# Patient Record
Sex: Female | Born: 1995 | Race: White | Hispanic: No | Marital: Single | Smoking: Never smoker
Health system: Southern US, Community
[De-identification: ages and names within clinical notes are randomized; demographics above are authoritative.]

## PROBLEM LIST (undated history)

## (undated) ENCOUNTER — Emergency Department (HOSPITAL_COMMUNITY): Admission: EM | Payer: Self-pay | Source: Home / Self Care

---
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
## 2015-03-29 ENCOUNTER — Ambulatory Visit (INDEPENDENT_AMBULATORY_CARE_PROVIDER_SITE_OTHER): Payer: Medicaid Other | Admitting: Family

## 2015-03-29 ENCOUNTER — Encounter: Payer: Self-pay | Admitting: Family

## 2015-03-29 VITALS — BP 109/72 | HR 68 | Temp 97.8°F | Ht 67.5 in | Wt 211.8 lb

## 2015-03-29 DIAGNOSIS — Z30011 Encounter for initial prescription of contraceptive pills: Secondary | ICD-10-CM

## 2015-03-29 DIAGNOSIS — Z23 Encounter for immunization: Secondary | ICD-10-CM | POA: Diagnosis not present

## 2015-03-29 LAB — POCT URINE PREGNANCY: Preg Test, Ur: NEGATIVE

## 2015-03-29 MED ORDER — NORGESTIM-ETH ESTRAD TRIPHASIC 0.18/0.215/0.25 MG-25 MCG PO TABS: 1.0000 | ORAL_TABLET | Freq: Every day | ORAL | Status: DC

## 2015-03-29 NOTE — Patient Instructions (Signed)
Health Maintenance, Female Adopting a healthy lifestyle and getting preventive care can go a long way to promote health and wellness. Talk with your health care provider about what schedule of regular examinations is right for you. This is a good chance for you to check in with your provider about disease prevention and staying healthy. In between checkups, there are plenty of things you can do on your own. Experts have done a lot of research about which lifestyle changes and preventive measures are most likely to keep you healthy. Ask your health care provider for more information. WEIGHT AND DIET  Eat a healthy diet  Be sure to include plenty of vegetables, fruits, low-fat dairy products, and lean protein.  Do not eat a lot of foods high in solid fats, added sugars, or salt.  Get regular exercise. This is one of the most important things you can do for your health.  Most adults should exercise for at least 150 minutes each week. The exercise should increase your heart rate and make you sweat (moderate-intensity exercise).  Most adults should also do strengthening exercises at least twice a week. This is in addition to the moderate-intensity exercise.  Maintain a healthy weight  Body mass index (BMI) is a measurement that can be used to identify possible weight problems. It estimates body fat based on height and weight. Your health care provider can help determine your BMI and help you achieve or maintain a healthy weight.  For females 20 years of age and older:   A BMI below 18.5 is considered underweight.  A BMI of 18.5 to 24.9 is normal.  A BMI of 25 to 29.9 is considered overweight.  A BMI of 30 and above is considered obese.  Watch levels of cholesterol and blood lipids  You should start having your blood tested for lipids and cholesterol at 20 years of age, then have this test every 5 years.  You may need to have your cholesterol levels checked more often if:  Your lipid  or cholesterol levels are high.  You are older than 19 years of age.  You are at high risk for heart disease.  CANCER SCREENING   Lung Cancer  Lung cancer screening is recommended for adults 55-80 years old who are at high risk for lung cancer because of a history of smoking.  A yearly low-dose CT scan of the lungs is recommended for people who:  Currently smoke.  Have quit within the past 15 years.  Have at least a 30-pack-year history of smoking. A pack year is smoking an average of one pack of cigarettes a day for 1 year.  Yearly screening should continue until it has been 15 years since you quit.  Yearly screening should stop if you develop a health problem that would prevent you from having lung cancer treatment.  Breast Cancer  Practice breast self-awareness. This means understanding how your breasts normally appear and feel.  It also means doing regular breast self-exams. Let your health care provider know about any changes, no matter how small.  If you are in your 20s or 30s, you should have a clinical breast exam (CBE) by a health care provider every 1-3 years as part of a regular health exam.  If you are 40 or older, have a CBE every year. Also consider having a breast X-ray (mammogram) every year.  If you have a family history of breast cancer, talk to your health care provider about genetic screening.  If you   are at high risk for breast cancer, talk to your health care provider about having an MRI and a mammogram every year.  Breast cancer gene (BRCA) assessment is recommended for women who have family members with BRCA-related cancers. BRCA-related cancers include:  Breast.  Ovarian.  Tubal.  Peritoneal cancers.  Results of the assessment will determine the need for genetic counseling and BRCA1 and BRCA2 testing. Cervical Cancer Your health care provider may recommend that you be screened regularly for cancer of the pelvic organs (ovaries, uterus, and  vagina). This screening involves a pelvic examination, including checking for microscopic changes to the surface of your cervix (Pap test). You may be encouraged to have this screening done every 3 years, beginning at age 21.  For women ages 30-65, health care providers may recommend pelvic exams and Pap testing every 3 years, or they may recommend the Pap and pelvic exam, combined with testing for human papilloma virus (HPV), every 5 years. Some types of HPV increase your risk of cervical cancer. Testing for HPV may also be done on women of any age with unclear Pap test results.  Other health care providers may not recommend any screening for nonpregnant women who are considered low risk for pelvic cancer and who do not have symptoms. Ask your health care provider if a screening pelvic exam is right for you.  If you have had past treatment for cervical cancer or a condition that could lead to cancer, you need Pap tests and screening for cancer for at least 20 years after your treatment. If Pap tests have been discontinued, your risk factors (such as having a new sexual partner) need to be reassessed to determine if screening should resume. Some women have medical problems that increase the chance of getting cervical cancer. In these cases, your health care provider may recommend more frequent screening and Pap tests. Colorectal Cancer  This type of cancer can be detected and often prevented.  Routine colorectal cancer screening usually begins at 19 years of age and continues through 19 years of age.  Your health care provider may recommend screening at an earlier age if you have risk factors for colon cancer.  Your health care provider may also recommend using home test kits to check for hidden blood in the stool.  A small camera at the end of a tube can be used to examine your colon directly (sigmoidoscopy or colonoscopy). This is done to check for the earliest forms of colorectal  cancer.  Routine screening usually begins at age 50.  Direct examination of the colon should be repeated every 5-10 years through 19 years of age. However, you may need to be screened more often if early forms of precancerous polyps or small growths are found. Skin Cancer  Check your skin from head to toe regularly.  Tell your health care provider about any new moles or changes in moles, especially if there is a change in a mole's shape or color.  Also tell your health care provider if you have a mole that is larger than the size of a pencil eraser.  Always use sunscreen. Apply sunscreen liberally and repeatedly throughout the day.  Protect yourself by wearing long sleeves, pants, a wide-brimmed hat, and sunglasses whenever you are outside. HEART DISEASE, DIABETES, AND HIGH BLOOD PRESSURE   High blood pressure causes heart disease and increases the risk of stroke. High blood pressure is more likely to develop in:  People who have blood pressure in the high end   of the normal range (130-139/85-89 mm Hg).  People who are overweight or obese.  People who are African American.  If you are 38-23 years of age, have your blood pressure checked every 3-5 years. If you are 61 years of age or older, have your blood pressure checked every year. You should have your blood pressure measured twice--once when you are at a hospital or clinic, and once when you are not at a hospital or clinic. Record the average of the two measurements. To check your blood pressure when you are not at a hospital or clinic, you can use:  An automated blood pressure machine at a pharmacy.  A home blood pressure monitor.  If you are between 45 years and 39 years old, ask your health care provider if you should take aspirin to prevent strokes.  Have regular diabetes screenings. This involves taking a blood sample to check your fasting blood sugar level.  If you are at a normal weight and have a low risk for diabetes,  have this test once every three years after 19 years of age.  If you are overweight and have a high risk for diabetes, consider being tested at a younger age or more often. PREVENTING INFECTION  Hepatitis B  If you have a higher risk for hepatitis B, you should be screened for this virus. You are considered at high risk for hepatitis B if:  You were born in a country where hepatitis B is common. Ask your health care provider which countries are considered high risk.  Your parents were born in a high-risk country, and you have not been immunized against hepatitis B (hepatitis B vaccine).  You have HIV or AIDS.  You use needles to inject street drugs.  You live with someone who has hepatitis B.  You have had sex with someone who has hepatitis B.  You get hemodialysis treatment.  You take certain medicines for conditions, including cancer, organ transplantation, and autoimmune conditions. Hepatitis C  Blood testing is recommended for:  Everyone born from [birth year] through 1965.  Anyone with known risk factors for hepatitis C. Sexually transmitted infections (STIs)  You should be screened for sexually transmitted infections (STIs) including gonorrhea and chlamydia if:  You are sexually active and are younger than 19 years of age.  You are older than 19 years of age and your health care provider tells you that you are at risk for this type of infection.  Your sexual activity has changed since you were last screened and you are at an increased risk for chlamydia or gonorrhea. Ask your health care provider if you are at risk.  If you do not have HIV, but are at risk, it may be recommended that you take a prescription medicine daily to prevent HIV infection. This is called pre-exposure prophylaxis (PrEP). You are considered at risk if:  You are sexually active and do not regularly use condoms or know the HIV status of your partner(s).  You take drugs by injection.  You are sexually  active with a partner who has HIV. Talk with your health care provider about whether you are at high risk of being infected with HIV. If you choose to begin PrEP, you should first be tested for HIV. You should then be tested every 3 months for as long as you are taking PrEP.  PREGNANCY   If you are premenopausal and you may become pregnant, ask your health care provider about preconception counseling.  If you may  become pregnant, take 400 to 800 micrograms (mcg) of folic acid every day.  If you want to prevent pregnancy, talk to your health care provider about birth control (contraception). OSTEOPOROSIS AND MENOPAUSE   Osteoporosis is a disease in which the bones lose minerals and strength with aging. This can result in serious bone fractures. Your risk for osteoporosis can be identified using a bone density scan.  If you are 61 years of age or older, or if you are at risk for osteoporosis and fractures, ask your health care provider if you should be screened.  Ask your health care provider whether you should take a calcium or vitamin D supplement to lower your risk for osteoporosis.  Menopause may have certain physical symptoms and risks.  Hormone replacement therapy may reduce some of these symptoms and risks. Talk to your health care provider about whether hormone replacement therapy is right for you.  HOME CARE INSTRUCTIONS   Schedule regular health, dental, and eye exams.  Stay current with your immunizations.   Do not use any tobacco products including cigarettes, chewing tobacco, or electronic cigarettes.  If you are pregnant, do not drink alcohol.  If you are breastfeeding, limit how much and how often you drink alcohol.  Limit alcohol intake to no more than 1 drink per day for nonpregnant women. One drink equals 12 ounces of beer, 5 ounces of wine, or 1 ounces of hard liquor.  Do not use street drugs.  Do not share needles.  Ask your health care provider for help if  you need support or information about quitting drugs.  Tell your health care provider if you often feel depressed.  Tell your health care provider if you have ever been abused or do not feel safe at home.   This information is not intended to replace advice given to you by your health care provider. Make sure you discuss any questions you have with your health care provider.   Document Released: 12/22/2010 Document Revised: 06/29/2014 Document Reviewed: 05/10/2013 Elsevier Interactive Patient Education [employer].

## 2015-03-29 NOTE — Progress Notes (Signed)
   Subjective:    Patient ID: Deborah Nelson, female    DOB: [birth year], 19 y.o.   MRN: [id number]  HPI Pt presents to the office today to restart her birth control. Pt was taking Ortho Tri-Cyclen Lo and stopped taking it in February. Pt states her prescription "ran out" and she didn't have time to come in for an appointment. Pt states she never had any problems or concerns while taking Ortho Tri-Cyclen L and would like to restart that medication. PT states she is sexually active.   Review of Systems  Constitutional: Negative.   HENT: Negative.   Eyes: Negative.   Respiratory: Negative.  Negative for shortness of breath.   Cardiovascular: Negative.  Negative for palpitations.  Gastrointestinal: Negative.   Endocrine: Negative.   Genitourinary: Negative.   Musculoskeletal: Negative.   Neurological: Negative.  Negative for headaches.  Hematological: Negative.   Psychiatric/Behavioral: Negative.   All other systems reviewed and are negative.      Objective:   Physical Exam  Constitutional: She is oriented to person, place, and time. She appears well-developed and well-nourished. No distress.  HENT:  Head: Normocephalic and atraumatic.  Right Ear: External ear normal.  Left Ear: External ear normal.  Nose: Nose normal.  Mouth/Throat: Oropharynx is clear and moist.  Eyes: Pupils are equal, round, and reactive to light.  Neck: Normal range of motion. Neck supple. No thyromegaly present.  Cardiovascular: Normal rate, regular rhythm, normal heart sounds and intact distal pulses.   No murmur heard. Pulmonary/Chest: Effort normal and breath sounds normal. No respiratory distress. She has no wheezes.  Abdominal: Soft. Bowel sounds are normal. She exhibits no distension. There is no tenderness.  Musculoskeletal: Normal range of motion. She exhibits no edema or tenderness.  Neurological: She is alert and oriented to person, place, and time. She has normal reflexes. No cranial nerve  deficit.  Skin: Skin is warm and dry.  Psychiatric: She has a normal mood and affect. Her behavior is normal. Judgment and thought content normal.  Vitals reviewed.   BP 109/72 mmHg  Pulse 68  Temp(Src) 97.8 F (36.6 C) (Oral)  Ht 5' 7.5" (1.715 m)  Wt 211 lb 12.8 oz (96.072 kg)  BMI 32.66 kg/m2       Assessment & Plan:  1. Encounter for initial prescription of contraceptive pills -Safe sex discussed -Discussed how and when to take medication and what to do if missed dose -RTO in 1 year - POCT urine pregnancy - Norgestimate-Ethinyl Estradiol Triphasic (ORTHO TRI-CYCLEN LO) 0.18/0.215/0.25 MG-25 MCG tab; Take 1 tablet by mouth daily.  Dispense: 1 Package; Refill: 11   Labs pending Health Maintenance reviewed-Flu vaccine given today Diet and exercise encouraged RTO 1 year  Jannifer Rodney, FNP

## 2015-07-29 ENCOUNTER — Ambulatory Visit (INDEPENDENT_AMBULATORY_CARE_PROVIDER_SITE_OTHER): Payer: Medicaid Other | Admitting: Family Medicine

## 2015-07-29 ENCOUNTER — Encounter: Payer: Self-pay | Admitting: Family Medicine

## 2015-07-29 VITALS — BP 117/73 | HR 70 | Temp 97.6°F | Ht 67.51 in | Wt 215.8 lb

## 2015-07-29 DIAGNOSIS — R079 Chest pain, unspecified: Secondary | ICD-10-CM | POA: Diagnosis not present

## 2015-07-29 DIAGNOSIS — R0789 Other chest pain: Secondary | ICD-10-CM

## 2015-07-29 NOTE — Patient Instructions (Signed)
Great to meet you!  Please come back if anything that worries you occurs.   Nonspecific Chest Pain  Chest pain can be caused by many different conditions. There is always a chance that your pain could be related to something serious, such as a heart attack or a blood clot in your lungs. Chest pain can also be caused by conditions that are not life-threatening. If you have chest pain, it is very important to follow up with your health care provider. CAUSES  Chest pain can be caused by:  Heartburn.  Pneumonia or bronchitis.  Anxiety or stress.  Inflammation around your heart (pericarditis) or lung (pleuritis or pleurisy).  A blood clot in your lung.  A collapsed lung (pneumothorax). It can develop suddenly on its own (spontaneous pneumothorax) or from trauma to the chest.  Shingles infection (varicella-zoster virus).  Heart attack.  Damage to the bones, muscles, and cartilage that make up your chest wall. This can include:  Bruised bones due to injury.  Strained muscles or cartilage due to frequent or repeated coughing or overwork.  Fracture to one or more ribs.  Sore cartilage due to inflammation (costochondritis). RISK FACTORS  Risk factors for chest pain may include:  Activities that increase your risk for trauma or injury to your chest.  Respiratory infections or conditions that cause frequent coughing.  Medical conditions or overeating that can cause heartburn.  Heart disease or family history of heart disease.  Conditions or health behaviors that increase your risk of developing a blood clot.  Having had chicken pox (varicella zoster). SIGNS AND SYMPTOMS Chest pain can feel like:  Burning or tingling on the surface of your chest or deep in your chest.  Crushing, pressure, aching, or squeezing pain.  Dull or sharp pain that is worse when you move, cough, or take a deep breath.  Pain that is also felt in your back, neck, shoulder, or arm, or pain that  spreads to any of these areas. Your chest pain may come and go, or it may stay constant. DIAGNOSIS Lab tests or other studies may be needed to find the cause of your pain. Your health care provider may have you take a test called an ambulatory ECG (electrocardiogram). An ECG records your heartbeat patterns at the time the test is performed. You may also have other tests, such as:  Transthoracic echocardiogram (TTE). During echocardiography, sound waves are used to create a picture of all of the heart structures and to look at how blood flows through your heart.  Transesophageal echocardiogram (TEE).This is a more advanced imaging test that obtains images from inside your body. It allows your health care provider to see your heart in finer detail.  Cardiac monitoring. This allows your health care provider to monitor your heart rate and rhythm in real time.  Holter monitor. This is a portable device that records your heartbeat and can help to diagnose abnormal heartbeats. It allows your health care provider to track your heart activity for several days, if needed.  Stress tests. These can be done through exercise or by taking medicine that makes your heart beat more quickly.  Blood tests.  Imaging tests. TREATMENT  Your treatment depends on what is causing your chest pain. Treatment may include:  Medicines. These may include:  Acid blockers for heartburn.  Anti-inflammatory medicine.  Pain medicine for inflammatory conditions.  Antibiotic medicine, if an infection is present.  Medicines to dissolve blood clots.  Medicines to treat coronary artery disease.  Supportive  care for conditions that do not require medicines. This may include:  Resting.  Applying heat or cold packs to injured areas.  Limiting activities until pain decreases. HOME CARE INSTRUCTIONS  If you were prescribed an antibiotic medicine, finish it all even if you start to feel better.  Avoid any activities  that bring on chest pain.  Do not use any tobacco products, including cigarettes, chewing tobacco, or electronic cigarettes. If you need help quitting, ask your health care provider.  Do not drink alcohol.  Take medicines only as directed by your health care provider.  Keep all follow-up visits as directed by your health care provider. This is important. This includes any further testing if your chest pain does not go away.  If heartburn is the cause for your chest pain, you may be told to keep your head raised (elevated) while sleeping. This reduces the chance that acid will go from your stomach into your esophagus.  Make lifestyle changes as directed by your health care provider. These may include:  Getting regular exercise. Ask your health care provider to suggest some activities that are safe for you.  Eating a heart-healthy diet. A registered dietitian can help you to learn healthy eating options.  Maintaining a healthy weight.  Managing diabetes, if necessary.  Reducing stress. SEEK MEDICAL CARE IF:  Your chest pain does not go away after treatment.  You have a rash with blisters on your chest.  You have a fever. SEEK IMMEDIATE MEDICAL CARE IF:   Your chest pain is worse.  You have an increasing cough, or you cough up blood.  You have severe abdominal pain.  You have severe weakness.  You faint.  You have chills.  You have sudden, unexplained chest discomfort.  You have sudden, unexplained discomfort in your arms, back, neck, or jaw.  You have shortness of breath at any time.  You suddenly start to sweat, or your skin gets clammy.  You feel nauseous or you vomit.  You suddenly feel light-headed or dizzy.  Your heart begins to beat quickly, or it feels like it is skipping beats. These symptoms may represent a serious problem that is an emergency. Do not wait to see if the symptoms will go away. Get medical help right away. Call your local emergency  services (911 in the [place] Do not drive yourself to the hospital.   This information is not intended to replace advice given to you by your health care provider. Make sure you discuss any questions you have with your health care provider.   Document Released: 03/18/2005 Document Revised: 06/29/2014 Document Reviewed: 01/12/2014 Elsevier Interactive Patient Education [employer]

## 2015-07-29 NOTE — Progress Notes (Signed)
   HPI  Patient presents today here with chest pain.  Patient finds that her last 3 weeks she's had intermittent left-sided chest wall pain.  She describes an achy type pain comes off and on, is left-sided and radiates to her left arm intermittently It is not associated with eating, exertion, or deep inspiration. During the pain does seem to hurt worse to take a deep breath. She is not feeling she has any illness, cough, shortness of breath She does have mild dyspnea during the chest pain. It lasted a few minutes and resolves spontaneously after rest. It occurs at rest, while she is working, while she's holding objects.  She's never had similar pain before. She does not have any anxiety or palpitations.  No new activities, no recent activities that may have strained her chest muscles  PMH: Smoking status noted ROS: Per HPI  Objective: BP 117/73 mmHg  Pulse 70  Temp(Src) 97.6 F (36.4 C) (Oral)  Ht 5' 7.51" (1.715 m)  Wt 215 lb 12.8 oz (97.886 kg)  BMI 33.28 kg/m2 Gen: NAD, alert, cooperative with exam HEENT: NCAT CV: RRR, good S1/S2, no murmur, no reproduction of pain with palpation of the sternocostal joints Resp: CTABL, no wheezes, non-labored Ext: No edema, warm Neuro: Alert and oriented, No gross deficits  EKG- NSR  Assessment and plan:  # Chest wall pain. Atypical chest pain, Most likely muscul0-skeletal chest wall pain with intermittent spasms. Reassurance provided EKG is within normal limits today Scheduled aleve (2 pills twice daily) + ice or heat X 7 days Follow-up as needed   Murtis Sink, MD [place] [facility] 07/29/2015, 4:50 PM

## 2016-03-11 ENCOUNTER — Other Ambulatory Visit: Payer: Self-pay | Admitting: Family

## 2016-03-11 DIAGNOSIS — Z30011 Encounter for initial prescription of contraceptive pills: Secondary | ICD-10-CM

## 2016-05-07 ENCOUNTER — Other Ambulatory Visit: Payer: Self-pay | Admitting: Family

## 2016-05-07 DIAGNOSIS — Z30011 Encounter for initial prescription of contraceptive pills: Secondary | ICD-10-CM

## 2016-05-07 NOTE — Telephone Encounter (Signed)
Not seen in over 1 yr

## 2016-07-23 ENCOUNTER — Ambulatory Visit (INDEPENDENT_AMBULATORY_CARE_PROVIDER_SITE_OTHER): Payer: Medicaid Other | Admitting: Family

## 2016-07-23 ENCOUNTER — Encounter: Payer: Self-pay | Admitting: Family

## 2016-07-23 VITALS — BP 115/76 | HR 74 | Temp 97.5°F | Ht 67.5 in | Wt 188.0 lb

## 2016-07-23 DIAGNOSIS — Z3041 Encounter for surveillance of contraceptive pills: Secondary | ICD-10-CM | POA: Diagnosis not present

## 2016-07-23 DIAGNOSIS — A084 Viral intestinal infection, unspecified: Secondary | ICD-10-CM

## 2016-07-23 DIAGNOSIS — E663 Overweight: Secondary | ICD-10-CM | POA: Insufficient documentation

## 2016-07-23 MED ORDER — NORGESTIM-ETH ESTRAD TRIPHASIC 0.18/0.215/0.25 MG-25 MCG PO TABS: 1.0000 | ORAL_TABLET | Freq: Every day | ORAL | 3 refills | Status: DC

## 2016-07-23 MED ORDER — ONDANSETRON HCL 4 MG PO TABS: 4.0000 mg | ORAL_TABLET | Freq: Three times a day (TID) | ORAL | 0 refills | Status: DC | PRN

## 2016-07-23 NOTE — Patient Instructions (Signed)

## 2016-07-23 NOTE — Progress Notes (Signed)
   Subjective:    Patient ID: Deborah Nelson, female    DOB: 07-02-1995, 21 y.o.   MRN: [id number]  HPI Pt presents to the office today to get her OC refilled. Pt states she is sexually active at this time. PT is 21 year old and has never had a pap. Denies any vaginal discharge, abnormal bleeding, or pelvic pain. Pt states since Tuesday she has had N&V and diarrhea that has slightly improved. Pt denies any fever.    Review of Systems  Gastrointestinal: Positive for diarrhea, nausea and vomiting.  All other systems reviewed and are negative.      Objective:   Physical Exam  Constitutional: She is oriented to person, place, and time. She appears well-developed and well-nourished. No distress.  HENT:  Head: Normocephalic and atraumatic.  Right Ear: External ear normal.  Left Ear: External ear normal.  Nose: Mucosal edema and rhinorrhea present.  Mouth/Throat: Posterior oropharyngeal erythema present.  Eyes: Pupils are equal, round, and reactive to light.  Neck: Normal range of motion. Neck supple. No thyromegaly present.  Cardiovascular: Normal rate, regular rhythm, normal heart sounds and intact distal pulses.   No murmur heard. Pulmonary/Chest: Effort normal and breath sounds normal. No respiratory distress. She has no wheezes.  Abdominal: Soft. Bowel sounds are normal. She exhibits no distension. There is no tenderness.  Musculoskeletal: Normal range of motion. She exhibits no edema or tenderness.  Neurological: She is alert and oriented to person, place, and time.  Skin: Skin is warm and dry.  Psychiatric: She has a normal mood and affect. Her behavior is normal. Judgment and thought content normal.  Vitals reviewed.    BP 115/76   Pulse 74   Temp 97.5 F (36.4 C) (Oral)   Ht 5' 7.5" (1.715 m)   Wt 188 lb (85.3 kg)   BMI 29.01 kg/m       Assessment & Plan:  1. Encounter for surveillance of contraceptive pills -Safe sex discussed -Will get pap next year -  Norgestimate-Ethinyl Estradiol Triphasic (TRI-LO-ESTARYLLA) 0.18/0.215/0.25 MG-25 MCG tab; Take 1 tablet by mouth daily.  Dispense: 90 tablet; Refill: 3  2. Overweight (BMI 25.0-29.9)  3. Viral gastroenteritis -Force fluids -Bland diet -Good hand hygiene discussed  RTO Prn - ondansetron (ZOFRAN) 4 MG tablet; Take 1 tablet (4 mg total) by mouth every 8 (eight) hours as needed for nausea or vomiting.  Dispense: 20 tablet; Refill: 0   Jannifer Rodneyhristy Wyoma Genson, FNP

## 2016-12-22 ENCOUNTER — Encounter: Payer: Self-pay | Admitting: Physician Assistant

## 2016-12-22 ENCOUNTER — Ambulatory Visit (INDEPENDENT_AMBULATORY_CARE_PROVIDER_SITE_OTHER): Payer: Self-pay | Admitting: Physician Assistant

## 2016-12-22 VITALS — BP 103/72 | HR 88 | Temp 98.6°F | Ht 67.5 in | Wt 188.4 lb

## 2016-12-22 DIAGNOSIS — N912 Amenorrhea, unspecified: Secondary | ICD-10-CM | POA: Insufficient documentation

## 2016-12-22 DIAGNOSIS — K219 Gastro-esophageal reflux disease without esophagitis: Secondary | ICD-10-CM

## 2016-12-22 DIAGNOSIS — R110 Nausea: Secondary | ICD-10-CM | POA: Insufficient documentation

## 2016-12-22 LAB — PREGNANCY, URINE: PREG TEST UR: NEGATIVE

## 2016-12-22 MED ORDER — OMEPRAZOLE 20 MG PO CPDR: 20.0000 mg | DELAYED_RELEASE_CAPSULE | Freq: Every day | ORAL | 3 refills | Status: DC

## 2016-12-22 NOTE — Progress Notes (Signed)
BP 103/72   Pulse 88   Temp 98.6 F (37 C) (Oral)   Ht 5' 7.5" (1.715 m)   Wt 188 lb 6.4 oz (85.5 kg)   LMP 12/05/2016   BMI 29.07 kg/m    Subjective:    Patient ID: Deborah Nelson, female    DOB: 1996/06/22, 21 y.o.   MRN: [id number]  HPI: Deborah Nelson is a 21 y.o. female presenting on 12/22/2016 for Nausea  This patient has had nausea for about 3 weeks. She has never had long-standing problems with GERD in the past. She does not know of any family history. She did stop her birth control is approximately 6 weeks ago and had a 2 day cycle on June 14 to the 16th. Her urine pregnancy test was negative here today. She had 2 at home that were negative. She has it also after eating. There is not any specific food that makes it worse than the other.  Relevant past medical, surgical, family and social history reviewed and updated as indicated. Allergies and medications reviewed and updated.  History reviewed. No pertinent past medical history.  History reviewed. No pertinent surgical history.  Review of Systems  Constitutional: Negative.  Negative for activity change, fatigue and fever.  HENT: Negative.   Eyes: Negative.   Respiratory: Negative.  Negative for cough.   Cardiovascular: Negative.  Negative for chest pain.  Gastrointestinal: Positive for abdominal distention and nausea. Negative for abdominal pain.  Endocrine: Negative.   Genitourinary: Negative.  Negative for dysuria.  Musculoskeletal: Negative for arthralgias and back pain.  Skin: Negative.   Neurological: Negative.     Allergies as of 12/22/2016   No Known Allergies     Medication List       Accurate as of 12/22/16 12:01 PM. Always use your most recent med list.          omeprazole 20 MG capsule Commonly known as:  PRILOSEC Take 1 capsule (20 mg total) by mouth daily.          Objective:    BP 103/72   Pulse 88   Temp 98.6 F (37 C) (Oral)   Ht 5' 7.5" (1.715 m)   Wt 188 lb 6.4 oz (85.5 kg)    LMP 12/05/2016   BMI 29.07 kg/m   No Known Allergies  Physical Exam  Constitutional: She is oriented to person, place, and time. She appears well-developed and well-nourished.  HENT:  Head: Normocephalic and atraumatic.  Right Ear: Tympanic membrane, external ear and ear canal normal.  Left Ear: Tympanic membrane, external ear and ear canal normal.  Nose: Nose normal. No rhinorrhea.  Mouth/Throat: Oropharynx is clear and moist and mucous membranes are normal. No oropharyngeal exudate or posterior oropharyngeal erythema.  Eyes: Conjunctivae and EOM are normal. Pupils are equal, round, and reactive to light.  Neck: Normal range of motion. Neck supple.  Cardiovascular: Normal rate, regular rhythm, normal heart sounds and intact distal pulses.   Pulmonary/Chest: Effort normal and breath sounds normal.  Abdominal: Soft. Bowel sounds are normal. She exhibits no distension. There is no tenderness.  Neurological: She is alert and oriented to person, place, and time. She has normal reflexes.  Skin: Skin is warm and dry. No rash noted.  Psychiatric: She has a normal mood and affect. Her behavior is normal. Judgment and thought content normal.  Nursing note and vitals reviewed.   Results for orders placed or performed in visit on 03/29/15  POCT urine pregnancy  Result Value  Ref Range   Preg Test, Ur Negative Negative      Assessment & Plan:   1. Amenorrhea - Pregnancy, urine  NEGATIVE result  2. Gastroesophageal reflux disease without esophagitis  3. Nausea   Current Outpatient Prescriptions:  .  omeprazole (PRILOSEC) 20 MG capsule, Take 1 capsule (20 mg total) by mouth daily., Disp: 30 capsule, Rfl: 3  Continue all other maintenance medications as listed above.  Follow up plan: Return if symptoms worsen or fail to improve.  Educational handout given for GERD  Remus Loffler PA-C [facility] [address]  [place], [place]  [address] [phone]   12/22/2016, 12:01 PM

## 2016-12-22 NOTE — Patient Instructions (Signed)
Food Choices for Gastroesophageal Reflux Disease, Adult When you have gastroesophageal reflux disease (GERD), the foods you eat and your eating habits are very important. Choosing the right foods can help ease your discomfort. What guidelines do I need to follow?  Choose fruits, vegetables, whole grains, and low-fat dairy products.  Choose low-fat meat, fish, and poultry.  Limit fats such as oils, salad dressings, butter, nuts, and avocado.  Keep a food diary. This helps you identify foods that cause symptoms.  Avoid foods that cause symptoms. These may be different for everyone.  Eat small meals often instead of 3 large meals a day.  Eat your meals slowly, in a place where you are relaxed.  Limit fried foods.  Cook foods using methods other than frying.  Avoid drinking alcohol.  Avoid drinking large amounts of liquids with your meals.  Avoid bending over or lying down until 2-3 hours after eating. What foods are not recommended? These are some foods and drinks that may make your symptoms worse: Vegetables  Tomatoes. Tomato juice. Tomato and spaghetti sauce. Chili peppers. Onion and garlic. Horseradish. Fruits  Oranges, grapefruit, and lemon (fruit and juice). Meats  High-fat meats, fish, and poultry. This includes hot dogs, ribs, ham, sausage, salami, and bacon. Dairy  Whole milk and chocolate milk. Sour cream. Cream. Butter. Ice cream. Cream cheese. Drinks  Coffee and tea. Bubbly (carbonated) drinks or energy drinks. Condiments  Hot sauce. Barbecue sauce. Sweets/Desserts  Chocolate and cocoa. Donuts. Peppermint and spearmint. Fats and Oils  High-fat foods. This includes French fries and potato chips. Other  Vinegar. Strong spices. This includes black pepper, white pepper, red pepper, cayenne, curry powder, cloves, ginger, and chili powder. The items listed above may not be a complete list of foods and drinks to avoid. Contact your dietitian for more information.    This information is not intended to replace advice given to you by your health care provider. Make sure you discuss any questions you have with your health care provider. Document Released: 12/08/2011 Document Revised: 11/14/2015 Document Reviewed: 04/12/2013 Elsevier Interactive Patient Education  2017 Elsevier Inc.  

## 2017-02-19 ENCOUNTER — Encounter: Payer: Self-pay | Admitting: Family

## 2017-02-19 ENCOUNTER — Ambulatory Visit (INDEPENDENT_AMBULATORY_CARE_PROVIDER_SITE_OTHER): Payer: Self-pay | Admitting: Family

## 2017-02-19 ENCOUNTER — Other Ambulatory Visit: Payer: Self-pay | Admitting: Family

## 2017-02-19 VITALS — BP 117/73 | HR 84 | Temp 98.6°F | Ht 67.5 in | Wt 183.0 lb

## 2017-02-19 DIAGNOSIS — J020 Streptococcal pharyngitis: Secondary | ICD-10-CM

## 2017-02-19 DIAGNOSIS — J029 Acute pharyngitis, unspecified: Secondary | ICD-10-CM

## 2017-02-19 LAB — RAPID STREP SCREEN (MED CTR MEBANE ONLY): Strep Gp A Ag, IA W/Reflex: POSITIVE — AB

## 2017-02-19 MED ORDER — AMOXICILLIN 875 MG PO TABS: 875.0000 mg | ORAL_TABLET | Freq: Two times a day (BID) | ORAL | 0 refills | Status: DC

## 2017-02-19 NOTE — Progress Notes (Signed)
   Subjective:    Patient ID: Deborah Nelson, female    DOB: 1995/11/23, 21 y.o.   MRN: [id number]  Sore Throat   This is a new problem. The current episode started in the past 7 days. The problem has been gradually worsening. The pain is at a severity of 5/10. The pain is moderate. Associated symptoms include ear pain, headaches, a hoarse voice, a plugged ear sensation, swollen glands and trouble swallowing. Pertinent negatives include no coughing, ear discharge or shortness of breath. She has tried acetaminophen and gargles for the symptoms. The treatment provided mild relief.      Review of Systems  HENT: Positive for ear pain, hoarse voice and trouble swallowing. Negative for ear discharge.   Respiratory: Negative for cough and shortness of breath.   Neurological: Positive for headaches.  All other systems reviewed and are negative.      Objective:   Physical Exam  Constitutional: She is oriented to person, place, and time. She appears well-developed and well-nourished. No distress.  HENT:  Head: Normocephalic and atraumatic.  Right Ear: External ear normal.  Mouth/Throat: Oropharyngeal exudate and posterior oropharyngeal erythema present.  Eyes: Pupils are equal, round, and reactive to light.  Neck: Normal range of motion. Neck supple. No thyromegaly present.  Cardiovascular: Normal rate, regular rhythm, normal heart sounds and intact distal pulses.   No murmur heard. Pulmonary/Chest: Effort normal and breath sounds normal. No respiratory distress. She has no wheezes.  Abdominal: Soft. Bowel sounds are normal. She exhibits no distension. There is no tenderness.  Musculoskeletal: Normal range of motion. She exhibits no edema or tenderness.  Neurological: She is alert and oriented to person, place, and time. She has normal reflexes. No cranial nerve deficit.  Skin: Skin is warm and dry.  Psychiatric: She has a normal mood and affect. Her behavior is normal. Judgment and thought  content normal.  Vitals reviewed.     BP 117/73   Pulse 84   Temp 98.6 F (37 C) (Oral)   Ht 5' 7.5" (1.715 m)   Wt 183 lb (83 kg)   BMI 28.24 kg/m      Assessment & Plan:  1. Sore throat - Rapid strep screen (not at [facility])  2. Strep throat - Take meds as prescribed -Force fluids -For any cough or congestion -For fever or aces or pains- take tylenol or ibuprofen appropriate for age and weight. -Throat lozenges if help -New toothbrush in 3 days - amoxicillin (AMOXIL) 875 MG tablet; Take 1 tablet (875 mg total) by mouth 2 (two) times daily.  Dispense: 20 tablet; Refill: 0    Jannifer Rodneyhristy Allen Egerton, FNP

## 2017-02-19 NOTE — Patient Instructions (Signed)
Strep Throat Strep throat is a bacterial infection of the throat. Your health care provider may call the infection tonsillitis or pharyngitis, depending on whether there is swelling in the tonsils or at the back of the throat. Strep throat is most common during the cold months of the year in children who are 5-21 years of age, but it can happen during any season in people of any age. This infection is spread from person to person (contagious) through coughing, sneezing, or close contact. What are the causes? Strep throat is caused by the bacteria called Streptococcus pyogenes. What increases the risk? This condition is more likely to develop in:  People who spend time in crowded places where the infection can spread easily.  People who have close contact with someone who has strep throat.  What are the signs or symptoms? Symptoms of this condition include:  Fever or chills.  Redness, swelling, or pain in the tonsils or throat.  Pain or difficulty when swallowing.  White or yellow spots on the tonsils or throat.  Swollen, tender glands in the neck or under the jaw.  Red rash all over the body (rare).  How is this diagnosed? This condition is diagnosed by performing a rapid strep test or by taking a swab of your throat (throat culture test). Results from a rapid strep test are usually ready in a few minutes, but throat culture test results are available after one or two days. How is this treated? This condition is treated with antibiotic medicine. Follow these instructions at home: Medicines  Take over-the-counter and prescription medicines only as told by your health care provider.  Take your antibiotic as told by your health care provider. Do not stop taking the antibiotic even if you start to feel better.  Have family members who also have a sore throat or fever tested for strep throat. They may need antibiotics if they have the strep infection. Eating and drinking  Do not  share food, drinking cups, or personal items that could cause the infection to spread to other people.  If swallowing is difficult, try eating soft foods until your sore throat feels better.  Drink enough fluid to keep your urine clear or pale yellow. General instructions  Gargle with a salt-water mixture 3-4 times per day or as needed. To make a salt-water mixture, completely dissolve -1 tsp of salt in 1 cup of warm water.  Make sure that all household members wash their hands well.  Get plenty of rest.  Stay home from school or work until you have been taking antibiotics for 24 hours.  Keep all follow-up visits as told by your health care provider. This is important. Contact a health care provider if:  The glands in your neck continue to get bigger.  You develop a rash, cough, or earache.  You cough up a thick liquid that is green, yellow-brown, or bloody.  You have pain or discomfort that does not get better with medicine.  Your problems seem to be getting worse rather than better.  You have a fever. Get help right away if:  You have new symptoms, such as vomiting, severe headache, stiff or painful neck, chest pain, or shortness of breath.  You have severe throat pain, drooling, or changes in your voice.  You have swelling of the neck, or the skin on the neck becomes red and tender.  You have signs of dehydration, such as fatigue, dry mouth, and decreased urination.  You become increasingly sleepy, or   you cannot wake up completely.  Your joints become red or painful. This information is not intended to replace advice given to you by your health care provider. Make sure you discuss any questions you have with your health care provider. Document Released: 06/05/2000 Document Revised: 02/05/2016 Document Reviewed: 10/01/2014 Elsevier Interactive Patient Education  2017 Elsevier Inc.  

## 2017-02-20 ENCOUNTER — Ambulatory Visit: Payer: Self-pay

## 2017-04-29 ENCOUNTER — Encounter: Payer: Self-pay | Admitting: Family

## 2017-04-29 ENCOUNTER — Ambulatory Visit (INDEPENDENT_AMBULATORY_CARE_PROVIDER_SITE_OTHER): Payer: Self-pay | Admitting: Family

## 2017-04-29 VITALS — BP 104/66 | HR 84 | Temp 98.6°F | Ht 67.5 in | Wt 198.1 lb

## 2017-04-29 DIAGNOSIS — M5442 Lumbago with sciatica, left side: Secondary | ICD-10-CM

## 2017-04-29 MED ORDER — NAPROXEN 500 MG PO TABS: 500.0000 mg | ORAL_TABLET | Freq: Two times a day (BID) | ORAL | 1 refills | Status: AC

## 2017-04-29 MED ORDER — CYCLOBENZAPRINE HCL 5 MG PO TABS: 5.0000 mg | ORAL_TABLET | Freq: Three times a day (TID) | ORAL | 1 refills | Status: AC | PRN

## 2017-04-29 NOTE — Progress Notes (Signed)
   Subjective:    Patient ID: Deborah Nelson, female    DOB: October 17, 1995, 21 y.o.   MRN: [id number]  PT presents to the office today with back pain. Pt states she went to the ED on 04/08/17 and was diagnosed with Sciatica. Pt requesting her FMLA be filled out.  PT is self pay until January.  Back Pain  This is a new problem. The current episode started more than 1 month ago. The problem occurs intermittently. The problem has been waxing and waning since onset. The pain is present in the gluteal and lumbar spine. The quality of the pain is described as aching. The pain radiates to the left thigh. The pain is at a severity of 3/10. The pain is mild. The symptoms are aggravated by standing and coughing. Associated symptoms include leg pain, tingling and weakness. Pertinent negatives include no bladder incontinence or bowel incontinence. Risk factors include sedentary lifestyle. She has tried NSAIDs for the symptoms. The treatment provided mild relief.      Review of Systems  Gastrointestinal: Negative for bowel incontinence.  Genitourinary: Negative for bladder incontinence.  Musculoskeletal: Positive for back pain.  Neurological: Positive for tingling and weakness.  All other systems reviewed and are negative.      Objective:   Physical Exam  Constitutional: She is oriented to person, place, and time. She appears well-developed and well-nourished. No distress.  HENT:  Head: Normocephalic and atraumatic.  Eyes: Pupils are equal, round, and reactive to light.  Neck: Normal range of motion. Neck supple. No thyromegaly present.  Cardiovascular: Normal rate, regular rhythm, normal heart sounds and intact distal pulses.  No murmur heard. Pulmonary/Chest: Effort normal and breath sounds normal. No respiratory distress. She has no wheezes.  Abdominal: Soft. Bowel sounds are normal. She exhibits no distension. There is no tenderness.  Musculoskeletal: Normal range of motion. She exhibits no edema  or tenderness.  Negative SLR, lumbar pain with flexion   Neurological: She is alert and oriented to person, place, and time.  Skin: Skin is warm and dry.  Psychiatric: She has a normal mood and affect. Her behavior is normal. Judgment and thought content normal.  Vitals reviewed.   BP 104/66   Pulse 84   Temp 98.6 F (37 C) (Oral)   Ht 5' 7.5" (1.715 m)   Wt 198 lb 1.6 oz (89.9 kg)   BMI 30.57 kg/m      Assessment & Plan:  1. Acute left-sided low back pain with left-sided sciatica Rest Ice ROM exercises discussed We can refer to Physical Therapy if pain continues and after pt has insurance RTO prn  - cyclobenzaprine (FLEXERIL) 5 MG tablet; Take 1 tablet (5 mg total) 3 (three) times daily as needed by mouth for muscle spasms.  Dispense: 30 tablet; Refill: 1 - naproxen (NAPROSYN) 500 MG tablet; Take 1 tablet (500 mg total) 2 (two) times daily with a meal by mouth.  Dispense: 60 tablet; Refill: 1   Jannifer Rodneyhristy Oluwatamilore Starnes, FNP

## 2017-04-29 NOTE — Patient Instructions (Signed)
Spondylolisthesis Rehab  Ask your health care provider which exercises are safe for you. Do exercises exactly as told by your health care provider and adjust them as directed. It is normal to feel mild stretching, pulling, tightness, or discomfort as you do these exercises, but you should stop right away if you feel sudden pain or your pain gets worse. Do not begin these exercises until told by your health care provider.  Stretching and range of motion exercises  These exercises warm up your muscles and joints and improve the movement and flexibility of your hips and your back. These exercises may also help to relieve pain, numbness, and tingling.  Exercise A: Single knee to chest    1. Lie on your back on a firm surface with both legs straight.  2. Bend one of your knees. Use your hands to move your knee up toward your chest until you feel a gentle stretch in your lower back and buttock.  ? Hold your leg in this position by holding onto the front of your knee.  ? Keep your other leg as straight as possible.  3. Hold for __________ seconds.  4. Slowly return to the starting position.  5. Repeat this exercise with your other leg.  Repeat __________ times. Complete this exercise __________ times a day.  Exercise B: Double knee to chest    1. Lie on your back on a firm surface with both legs straight.  2. Bend one of your knees and move it toward your chest until you feel a gentle stretch in your lower back and buttock.  3. Tense your abdominal muscles and repeat the previous step with your other leg.  4. Hold both of your legs in this position by holding onto the backs of your thighs or the fronts of your knees.  5. Hold for __________ seconds.  6. Tense your abdominal muscles and slowly move your legs back to the floor, one leg at a time.  Repeat __________ times. Complete this exercise __________ times a day.  Strengthening exercises  These exercises build strength and endurance in your back. Endurance is the  ability to use your muscles for a long time, even after they get tired.  Exercise C: Pelvic tilt  1. Lie on your back on a firm bed or the floor. Bend your knees and keep your feet flat.  2. Tense your abdominal muscles. Tip your pelvis up toward the ceiling and flatten your lower back into the floor.  ? To help with this exercise, you may place a small towel under your lower back and try to push your back into the towel.  3. Hold for __________ seconds.  4. Let your muscles relax completely before you repeat this exercise.  Repeat __________ times. Complete this exercise __________ times a day.  Exercise D: Abdominal crunch    1. Lie on your back on a firm surface. Bend your knees and keep your feet flat. Cross your arms over your chest.  2. Tuck your chin down toward your chest, without bending your neck.  3. Use your abdominal muscles to lift your upper body off of the ground, straight up into the air.  ? Try to lift yourself until your shoulder blades are off the ground. You may need to work up to this.  ? Keep your lower back on the ground while you crunch upward.  ? Do not hold your breath.  4. Slowly lower yourself down. Keep your abdominal muscles tense until   you are back to the starting position.  Repeat __________ times. Complete this exercise __________ times a day.  Exercise E: Alternating arm and leg raises    1. Get on your hands and knees on a firm surface. If you are on a hard floor, you may want to use padding to cushion your knees, such as an exercise mat.  2. Line up your arms and legs. Your hands should be below your shoulders, and your knees should be below your hips.  3. Lift your left leg behind you. At the same time, raise your right arm and straighten it in front of you.  ? Do not lift your leg higher than your hip.  ? Do not lift your arm higher than your shoulder.  ? Keep your abdominal and back muscles tight.  ? Keep your hips facing the ground.  ? Do not arch your back.  ? Keep your  balance carefully, and do not hold your breath.  4. Hold for __________ seconds.  5. Slowly return to the starting position and repeat with your right leg and your left arm.  Repeat __________ times. Complete this exercise __________ times a day.  Posture and body mechanics    Body mechanics refers to the movements and positions of your body while you do your daily activities. Posture is part of body mechanics. Good posture and healthy body mechanics can help to relieve stress in your body's tissues and joints. Good posture means that your spine is in its natural S-curve position (your spine is neutral), your shoulders are pulled back slightly, and your head is not tipped forward. The following are general guidelines for applying improved posture and body mechanics to your everyday activities.  Standing     When standing, keep your spine neutral and your feet about hip-width apart. Keep a slight bend in your knees. Your ears, shoulders, and hips should line up.   When you do a task in which you stand in one place for a long time, place one foot up on a stable object that is 2-4 inches (5-10 cm) high, such as a footstool. This helps keep your spine neutral.  Sitting     When sitting, keep your spine neutral and keep your feet flat on the floor. Use a footrest, if necessary, and keep your thighs parallel to the floor. Avoid rounding your shoulders, and avoid tilting your head forward.   When working at a desk or a computer, keep your desk at a height where your hands are slightly lower than your elbows. Slide your chair under your desk so you are close enough to maintain good posture.   When working at a computer, place your monitor at a height where you are looking straight ahead and you do not have to tilt your head forward or downward to look at the screen.  Resting    When lying down and resting, avoid positions that are most painful for you.   If you have pain with activities such as sitting, bending,  stooping, or squatting (flexion-based activities), lie in a position in which your body does not bend very much. For example, avoid curling up on your side with your arms and knees near your chest (fetal position).   If you have pain with activities such as standing for a long time or reaching with your arms (extension-based activities), lie with your spine in a neutral position and bend your knees slightly. Try the following positions:  ? Lying on   your side with a pillow between your knees.  ? Lying on your back with a pillow under your knees.    Lifting     When lifting objects, keep your feet at least shoulder-width apart and tighten your abdominal muscles.   Bend your knees and hips and keep your spine neutral. It is important to lift using the strength of your legs, not your back. Do not lock your knees straight out.   Always ask for help to lift heavy or awkward objects.  This information is not intended to replace advice given to you by your health care provider. Make sure you discuss any questions you have with your health care provider.  Document Released: 06/08/2005 Document Revised: 02/13/2016 Document Reviewed: 03/19/2015  [employer] Patient Education  2018 [employer]

## 2017-10-04 ENCOUNTER — Other Ambulatory Visit: Payer: Self-pay

## 2022-07-21 ENCOUNTER — Ambulatory Visit (HOSPITAL_COMMUNITY): Payer: Self-pay
